# Patient Record
Sex: Female | Born: 1970 | Hispanic: Yes | Marital: Married | Smoking: Never smoker
Health system: Southern US, Community
[De-identification: ages and names within clinical notes are randomized; demographics above are authoritative.]

## PROBLEM LIST (undated history)

## (undated) DIAGNOSIS — E119 Type 2 diabetes mellitus without complications: Secondary | ICD-10-CM

## (undated) HISTORY — PX: ABDOMINAL SURGERY: SHX537

---

## 2005-08-19 ENCOUNTER — Emergency Department: Payer: Self-pay | Admitting: Emergency Medicine

## 2014-12-18 DIAGNOSIS — N611 Abscess of the breast and nipple: Secondary | ICD-10-CM | POA: Insufficient documentation

## 2015-07-07 DIAGNOSIS — N61 Mastitis without abscess: Secondary | ICD-10-CM | POA: Insufficient documentation

## 2016-05-22 ENCOUNTER — Ambulatory Visit
Admission: EM | Admit: 2016-05-22 | Discharge: 2016-05-22 | Disposition: A | Discharge status: Home / Self Care | Payer: Worker's Compensation | Attending: Emergency Medicine | Admitting: Emergency Medicine

## 2016-05-22 ENCOUNTER — Encounter: Payer: Self-pay | Admitting: Gynecology

## 2016-05-22 ENCOUNTER — Ambulatory Visit (INDEPENDENT_AMBULATORY_CARE_PROVIDER_SITE_OTHER): Payer: Worker's Compensation

## 2016-05-22 DIAGNOSIS — S93402A Sprain of unspecified ligament of left ankle, initial encounter: Secondary | ICD-10-CM

## 2016-05-22 DIAGNOSIS — T07XXXA Unspecified multiple injuries, initial encounter: Secondary | ICD-10-CM

## 2016-05-22 DIAGNOSIS — T148 Other injury of unspecified body region: Secondary | ICD-10-CM | POA: Diagnosis not present

## 2016-05-22 DIAGNOSIS — S39012A Strain of muscle, fascia and tendon of lower back, initial encounter: Secondary | ICD-10-CM

## 2016-05-22 MED ORDER — MELOXICAM 15 MG PO TABS: 15.0000 mg | ORAL_TABLET | Freq: Every day | ORAL | 0 refills | Status: DC

## 2016-05-22 NOTE — ED Provider Notes (Signed)
CSN: [id number]     Arrival date & time 05/22/16  1530 History   First MD Initiated Contact with Patient 05/22/16 1704     Chief Complaint  Patient presents with  . Work Related Injury   (Consider location/radiation/quality/duration/timing/severity/associated sxs/prior Treatment) HPI  Is a 45 year old female who is accompanied by her supervisor. Her supervisor is a Spanish-speaking individual and provides translational services during interview. 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 She states that she hurt her arm hip leg and foot as well as her low back. He has never injured her back in the past. She had no loss of consciousness but did hit her occipital area and felt dizzy but this has resolved at the present time   History reviewed. No pertinent past medical history. Past Surgical History:  Procedure Laterality Date  . ABDOMINAL SURGERY     Family History  Problem Relation Age of Onset  . Emphysema Mother    Social History  Substance Use Topics  . Smoking status: Never Smoker  . Smokeless tobacco: Never Used  . Alcohol use No   OB History    No data available     Review of Systems  Allergies  Review of patient's allergies indicates no known allergies.  Home Medications   Prior to Admission medications   Medication Sig Start Date End Date Taking? Authorizing Provider  meloxicam (MOBIC) 15 MG tablet Take 1 tablet (15 mg total) by mouth daily. 05/22/16   Lutricia Feil, PA-C   Meds Ordered and Administered this Visit  Medications - No data to display  BP 113/67 (BP Location: Right Arm)   Pulse 69   Temp 97.6 F (36.4 C) (Oral)   Resp 16   Ht 5' (1.524 m)   Wt 185 lb (83.9 kg)   LMP 05/19/2016   SpO2 99%   BMI 36.13 kg/m  No data found.   Physical Exam  Constitutional: She is oriented to person, place, and time. She appears well-developed and well-nourished. No  distress.  HENT:  Head: Normocephalic and atraumatic.  Right Ear: External ear normal.  Left Ear: External ear normal.  Mouth/Throat: Oropharynx is clear and moist.  Eyes: EOM are normal. Pupils are equal, round, and reactive to light. Right eye exhibits no discharge. Left eye exhibits no discharge.  Neck: Normal range of motion. Neck supple.  Examination cervical spine shows full range of motion discomfort at extremes of rotation. She does have some mild paracervical spinous muscle tenderness. There is no tenderness to palpation of the occipital area but no defect is noted. There is no hematoma .  Musculoskeletal: She exhibits edema and tenderness.  Examination lumbar spine shows tenderness to palpation in the lower lumbar segments L4-L5 paraspinal. He has good range of motion to forward flexion and bilateral flexion with complaints of pain with motion. Straight leg raise testing is negative and the seating positioning bilaterally 90.  Examination of the left shoulder shows no ecchymosis. She has good range of motion passively and actively. Is tenderness to palpation of the deltoid no deformity is seen there is no crepitus present.  Examination of the left elbow shows some abrasion over the proximal ulna with tenderness and ecchymosis present. Pronation and supination are full and comfortable. There is no crepitus present. He has full range of motion of the wrist.  Examination of the left ankle shows some  some swelling mostly over the anterior ankle and inferior to the lateral malleolus. Is tenderness over the anterior fibulotalar ligament. Subtalar motion is preserved.  Examination of the foot shows no ecchymosis or erythema there is no swelling present.  Neurological: She is alert and oriented to person, place, and time. She has normal reflexes. She displays normal reflexes. No cranial nerve deficit. She exhibits normal muscle tone. Coordination normal.  Skin: Skin is warm and dry. She is not  diaphoretic.  Psychiatric: She has a normal mood and affect. Her behavior is normal. Judgment and thought content normal.  Nursing note and vitals reviewed.   Urgent Care Course   Clinical Course    Procedures (including critical care time)  Labs Review Labs Reviewed - No data to display  Imaging Review Dg Elbow Complete Left  Result Date: 05/22/2016 CLINICAL DATA:  Larey SeatFell today injuring posterior left elbow with pain. Initial encounter. EXAM: LEFT ELBOW - COMPLETE 3+ VIEW COMPARISON:  None. FINDINGS: There is no evidence of fracture, dislocation, or joint effusion. There is no evidence of arthropathy or other focal bone abnormality. Soft tissues are unremarkable. IMPRESSION: Negative. Electronically Signed   By: Kennith CenterEric  Mansell M.D.   On: 05/22/2016 17:48   Dg Ankle Complete Left  Result Date: 05/22/2016 CLINICAL DATA:  Larey SeatFell today injuring left ankle.  Initial encounter. EXAM: LEFT ANKLE COMPLETE - 3+ VIEW COMPARISON:  None. FINDINGS: There is no evidence of fracture, dislocation, or joint effusion. There is no evidence of arthropathy or other focal bone abnormality. Soft tissues are unremarkable. IMPRESSION: Negative. Electronically Signed   By: Kennith CenterEric  Mansell M.D.   On: 05/22/2016 17:49     Visual Acuity Review  Right Eye Distance:   Left Eye Distance:   Bilateral Distance:    Right Eye Near:   Left Eye Near:    Bilateral Near:     Ace wrap was applied to the left ankle    MDM   1. Multiple contusions   2. Lumbar strain, initial encounter   3. Ankle sprain, left, initial encounter    New Prescriptions   MELOXICAM (MOBIC) 15 MG TABLET    Take 1 tablet (15 mg total) by mouth daily.  Plan: 1. Test/x-ray results and diagnosis reviewed with patient 2. rx as per orders; risks, benefits, potential side effects reviewed with patient 3. Recommend supportive treatment with Ice to injured areas 20 minutes out of every 2 hours as necessary for comfort. Restrictions were given to  the patient although she should be able to work under the restrictive guidelines. Restrictions are for 1 week and then she needs to [facility]. If she has any change in her sensorium or concentration or notices any neurological changes she should go immediately to the emergency room. 4. F/u prn if symptoms worsen or don't improve     Lutricia FeilWilliam P Yadhira Mckneely, PA-C 05/22/16 1823

## 2016-05-22 NOTE — Discharge Instructions (Signed)
Use ice for the bruises 20 minutes out of every 2 hours. I'll up 1 week with Hassell Halimommy Ann Moore FNP at [facility].

## 2016-05-22 NOTE — ED Triage Notes (Signed)
Per patient fell at work today and injury left side. Pt. Stated now with left lower back / arm and leg pain.

## 2016-07-30 ENCOUNTER — Emergency Department: Payer: Worker's Compensation

## 2016-07-30 ENCOUNTER — Emergency Department
Admission: EM | Admit: 2016-07-30 | Discharge: 2016-07-30 | Disposition: A | Discharge status: Home / Self Care | Payer: Worker's Compensation | Attending: Emergency Medicine | Admitting: Emergency Medicine

## 2016-07-30 DIAGNOSIS — Z79899 Other long term (current) drug therapy: Secondary | ICD-10-CM | POA: Insufficient documentation

## 2016-07-30 DIAGNOSIS — M545 Low back pain: Secondary | ICD-10-CM | POA: Insufficient documentation

## 2016-07-30 DIAGNOSIS — G8929 Other chronic pain: Secondary | ICD-10-CM

## 2016-07-30 LAB — URINALYSIS COMPLETE WITH MICROSCOPIC (ARMC ONLY)
Bacteria, UA: NONE SEEN
Bilirubin Urine: NEGATIVE
KETONES UR: NEGATIVE mg/dL
Leukocytes, UA: NEGATIVE
NITRITE: NEGATIVE
PROTEIN: 30 mg/dL — AB
Specific Gravity, Urine: 1.014 (ref 1.005–1.030)
pH: 5 (ref 5.0–8.0)

## 2016-07-30 LAB — BASIC METABOLIC PANEL
ANION GAP: 10 (ref 5–15)
BUN: 12 mg/dL (ref 6–20)
CALCIUM: 8.8 mg/dL — AB (ref 8.9–10.3)
CO2: 24 mmol/L (ref 22–32)
Chloride: 103 mmol/L (ref 101–111)
Creatinine, Ser: 0.67 mg/dL (ref 0.44–1.00)
GFR calc Af Amer: >60 mL/min (ref >60)
GFR calc non Af Amer: >60 mL/min (ref >60)
GLUCOSE: 209 mg/dL — AB (ref 65–99)
Potassium: 3.5 mmol/L (ref 3.5–5.1)
Sodium: 137 mmol/L (ref 135–145)

## 2016-07-30 LAB — CBC
HCT: 36.8 % (ref 35.0–47.0)
HEMOGLOBIN: 12.5 g/dL (ref 12.0–16.0)
MCH: 28.1 pg (ref 26.0–34.0)
MCHC: 33.9 g/dL (ref 32.0–36.0)
MCV: 82.9 fL (ref 80.0–100.0)
PLATELETS: 378 10*3/uL (ref 150–440)
RBC: 4.44 MIL/uL (ref 3.80–5.20)
RDW: 23.8 % — AB (ref 11.5–14.5)
WBC: 10.3 10*3/uL (ref 3.6–11.0)

## 2016-07-30 LAB — POCT PREGNANCY, URINE: Preg Test, Ur: NEGATIVE

## 2016-07-30 MED ORDER — NAPROXEN 500 MG PO TABS: 500.0000 mg | ORAL_TABLET | Freq: Two times a day (BID) | ORAL | 2 refills | Status: DC

## 2016-07-30 NOTE — ED Notes (Signed)
Patient transported to X-ray 

## 2016-07-30 NOTE — ED Provider Notes (Signed)
[facility] Emergency Department Provider Note   ____________________________________________    I have reviewed the triage vital signs and the nursing notes.   HISTORY  Chief Complaint Back Pain; Dizziness; and Vaginal Bleeding  Spanish interpreter used   HPI Stacy Lewis is a 45 y.o. female who presents with complaints of back pain for the last 2 months. She reports she has had back pain ever since she had a fall. She reports the pain is worse at the end of the day and is worse with bending and twisting. She denies numbness or tingling in her lower extremities. She denies weakness in her lower Eugenie FillerShirley Spears and difficulty with urination. She also reports she has been having vaginal spotting for the last 2 months as well, she is working with her PCP about this. She also reports she had dizziness yesterday but feels well today. No abdominal pain. No chest pain.   No past medical history on file.  There are no active problems to display for this patient.   Past Surgical History:  Procedure Laterality Date  . ABDOMINAL SURGERY      Prior to Admission medications   Medication Sig Start Date End Date Taking? Authorizing Provider  meloxicam (MOBIC) 15 MG tablet Take 1 tablet (15 mg total) by mouth daily. 05/22/16   Lutricia FeilWilliam P Roemer, PA-C     Allergies Patient has no known allergies.  Family History  Problem Relation Age of Onset  . Emphysema Mother     Social History Social History  Substance Use Topics  . Smoking status: Never Smoker  . Smokeless tobacco: Never Used  . Alcohol use No    Review of Systems  Constitutional: No fever/chills Eyes: No visual changes.   Cardiovascular: Denies chest pain. Respiratory: Denies shortness of breath. Gastrointestinal: No abdominal pain.   Genitourinary: Negative for dysuria. Musculoskeletal: As above  Skin: Negative for rash. Neurological: Negative for headaches or  weakness  10-point ROS otherwise negative.  ____________________________________________   PHYSICAL EXAM:  VITAL SIGNS: ED Triage Vitals  Enc Vitals Group     BP 07/30/16 1714 122/73     Pulse Rate 07/30/16 1714 82     Resp 07/30/16 1714 18     Temp 07/30/16 1714 98.3 F (36.8 C)     Temp Source 07/30/16 1714 Oral     SpO2 07/30/16 1714 100 %     Weight 07/30/16 1729 185 lb (83.9 kg)     Height 07/30/16 1729 4\' 11"  (1.499 m)     Head Circumference --      Peak Flow --      Pain Score 07/30/16 1729 8     Pain Loc --      Pain Edu? --      Excl. in [place]? --     Constitutional: Alert and oriented. No acute distress. Pleasant and interactive Eyes: Conjunctivae are normal.   Nose: No congestion/rhinnorhea. Mouth/Throat: Mucous membranes are moist.   Neck:  Painless ROM Cardiovascular: Normal rate, regular rhythm.   Good peripheral circulation. Respiratory: Normal respiratory effort.  No retractions Gastrointestinal: Soft and nontender. No distention.  No CVA tenderness. Genitourinary: deferred Musculoskeletal: Back: No vertebral tenderness to palpation, no muscle spasms felt. Normal flexion and rotation without pain. No lower extremity edema..  Lower extremities Warm and well perfused Neurologic:  Normal speech and language. No gross focal neurologic deficits are appreciated.  Skin:  Skin is warm, dry and intact. No rash noted.  ____________________________________________   LABS (all labs ordered are listed, but only abnormal results are displayed)  Labs Reviewed  BASIC METABOLIC PANEL - Abnormal; Notable for the following:       Result Value   Glucose, Bld 209 (*)    Calcium 8.8 (*)    All other components within normal limits  CBC - Abnormal; Notable for the following:    RDW 23.8 (*)    All other components within normal limits  URINALYSIS COMPLETEWITH MICROSCOPIC ([facility] ONLY) - Abnormal; Notable for the following:    Color, Urine YELLOW (*)    APPearance  HAZY (*)    Glucose, UA >500 (*)    Hgb urine dipstick 3+ (*)    Protein, ur 30 (*)    Squamous Epithelial / LPF 0-5 (*)    All other components within normal limits  POCT PREGNANCY, URINE   ____________________________________________  EKG  ED ECG REPORT I, Jene EveryKINNER, Avin Upperman, the attending physician, personally viewed and interpreted this ECG.  Date: 07/30/2016 EKG Time: 5:47 PM Rate: 81 Rhythm: normal sinus rhythm QRS Axis: normal Intervals: normal ST/T Wave abnormalities: normal Conduction Disturbances: none Narrative Interpretation: unremarkable  ____________________________________________  RADIOLOGY  X-rays normal ____________________________________________   PROCEDURES  Procedure(s) performed: No    Critical Care performed: No ____________________________________________   INITIAL IMPRESSION / ASSESSMENT AND PLAN / ED COURSE  Pertinent labs & imaging results that were available during my care of the patient were reviewed by me and considered in my medical decision making (see chart for details).  Patient presents with 2 months of back pain. She is ambulating well and is overall well-appearing. No concerning symptoms. We will check lumbar x-rays and if normal treat with NSAIDs and refer to ortho at her request  Clinical Course    ____________________________________________   FINAL CLINICAL IMPRESSION(S) / ED DIAGNOSES  Final diagnoses:  Chronic bilateral low back pain without sciatica      NEW MEDICATIONS STARTED DURING THIS VISIT:  New Prescriptions   No medications on file     Note:  This document was prepared using Dragon voice recognition software and may include unintentional dictation errors.    Jene Everyobert Iola Turri, MD 07/30/16 2102

## 2016-07-30 NOTE — ED Triage Notes (Signed)
Pt reports having a fall in 05/2016. Pt has been seen by two clinics for pain and given medicine for pain. Pt reports low back pain. Pt also reports headaches and dizziness since the fall in September. Pt reports she can not handle the back and foot pain. Pt also reports vaginal bleeding for two months.

## 2016-07-30 NOTE — ED Triage Notes (Signed)
Waiting for interpretor

## 2016-07-30 NOTE — ED Notes (Signed)
Interpretor request put in

## 2016-07-30 NOTE — ED Notes (Signed)
Interpreter request submitted to go over DC instructions with pt

## 2016-07-30 NOTE — ED Notes (Signed)
Pt states per interpreter that she has had little to no improvement in back pain since a fall in September. Pt reports the pain is in her lower back but denies pain radiating to leg and buttocks. Pt reports when she works it's difficult to bend over and turn from side to side. Pt also states she has had ongoing vaginal spotting for the last 2 months. Pt states she has her normal periods however is spotting throughout the rest of the month. Pt denies abd pain.

## 2019-03-10 ENCOUNTER — Other Ambulatory Visit: Payer: Self-pay | Admitting: Family Medicine

## 2019-03-10 DIAGNOSIS — Z20822 Contact with and (suspected) exposure to covid-19: Secondary | ICD-10-CM

## 2019-03-16 LAB — NOVEL CORONAVIRUS, NAA: SARS-CoV-2, NAA: DETECTED — AB

## 2019-06-18 ENCOUNTER — Other Ambulatory Visit: Payer: Self-pay

## 2019-06-18 DIAGNOSIS — Z20822 Contact with and (suspected) exposure to covid-19: Secondary | ICD-10-CM

## 2019-06-20 LAB — NOVEL CORONAVIRUS, NAA: SARS-CoV-2, NAA: NOT DETECTED

## 2020-06-25 ENCOUNTER — Ambulatory Visit
Admission: RE | Admit: 2020-06-25 | Discharge: 2020-06-25 | Disposition: A | Discharge status: Home / Self Care | Payer: Self-pay | Source: Ambulatory Visit | Attending: Oncology | Admitting: Oncology

## 2020-06-25 ENCOUNTER — Ambulatory Visit: Payer: Self-pay | Attending: Oncology | Admitting: *Deleted

## 2020-06-25 ENCOUNTER — Encounter: Payer: Self-pay | Admitting: *Deleted

## 2020-06-25 ENCOUNTER — Other Ambulatory Visit: Payer: Self-pay

## 2020-06-25 VITALS — BP 117/54 | HR 72 | Temp 97.1°F | Ht 59.5 in | Wt 184.9 lb

## 2020-06-25 DIAGNOSIS — Z0000 Encounter for general adult medical examination without abnormal findings: Secondary | ICD-10-CM

## 2020-06-25 NOTE — Patient Instructions (Signed)
Gave patient hand-out, Women Staying Healthy, Active and Well from [facility], with education on breast health, pap smears, heart and colon health. 

## 2020-06-25 NOTE — Progress Notes (Addendum)
  Subjective:     Patient ID: Stacy Lewis, female   DOB: 1971-08-01, 49 y.o.   MRN: [id number]  HPI   Review of Systems     Objective:   Physical Exam Chest:     Breasts:        Right: No swelling, bleeding, inverted nipple, mass, nipple discharge, skin change or tenderness.        Left: Inverted nipple present. No swelling, bleeding, mass, nipple discharge, skin change or tenderness.       Comments: Right nipple with a transverse slit Lymphadenopathy:     Upper Body:     Right upper body: No supraclavicular or axillary adenopathy.     Left upper body: No supraclavicular or axillary adenopathy.        Assessment:     49 year old Hispanic female referred to [facility] by the [facility] for clinical breast exam and mammogram.  Jacqui, the interpreter present during the interview and exam.  Clinical breast exam without dominant mass, nipple discharge or lymphadenopathy.  Left breast with inverted nipple, which is normal per patient.  Taught self breast awareness.  Last pap on 04/20/19 was negative / negative.  Patient has been screened for eligibility.  She does not have any insurance, Medicare or Medicaid.  She also meets financial eligibility.   Risk Assessment    Risk Scores      06/25/2020   Last edited by: Alta Corning, CMA   5-year risk: 0.5 %   Lifetime risk: 4.5 %            Plan:     Screening mammogram ordered.  Will follow up per [facility] protocol.

## 2020-06-30 ENCOUNTER — Inpatient Hospital Stay
Admission: RE | Admit: 2020-06-30 | Discharge: 2020-06-30 | Disposition: A | Discharge status: Home / Self Care | Payer: Self-pay | Source: Ambulatory Visit | Attending: *Deleted | Admitting: *Deleted

## 2020-06-30 ENCOUNTER — Other Ambulatory Visit: Payer: Self-pay | Admitting: *Deleted

## 2020-06-30 DIAGNOSIS — Z1231 Encounter for screening mammogram for malignant neoplasm of breast: Secondary | ICD-10-CM

## 2020-07-01 ENCOUNTER — Encounter: Payer: Self-pay | Admitting: *Deleted

## 2020-07-01 NOTE — Progress Notes (Signed)
Letter mailed from the [facility] to inform patient of her normal mammogram results.  Patient is to follow-up with annual screening in one year. 

## 2020-11-10 IMAGING — MG DIGITAL SCREENING BILAT W/ TOMO W/ CAD
8 series · 8 of 24 positions shown · non-contrast
Comparison: Previous exam(s).

CLINICAL DATA: Screening.

EXAM:
DIGITAL SCREENING BILATERAL MAMMOGRAM WITH TOMO AND CAD

[L CC synth-2D]
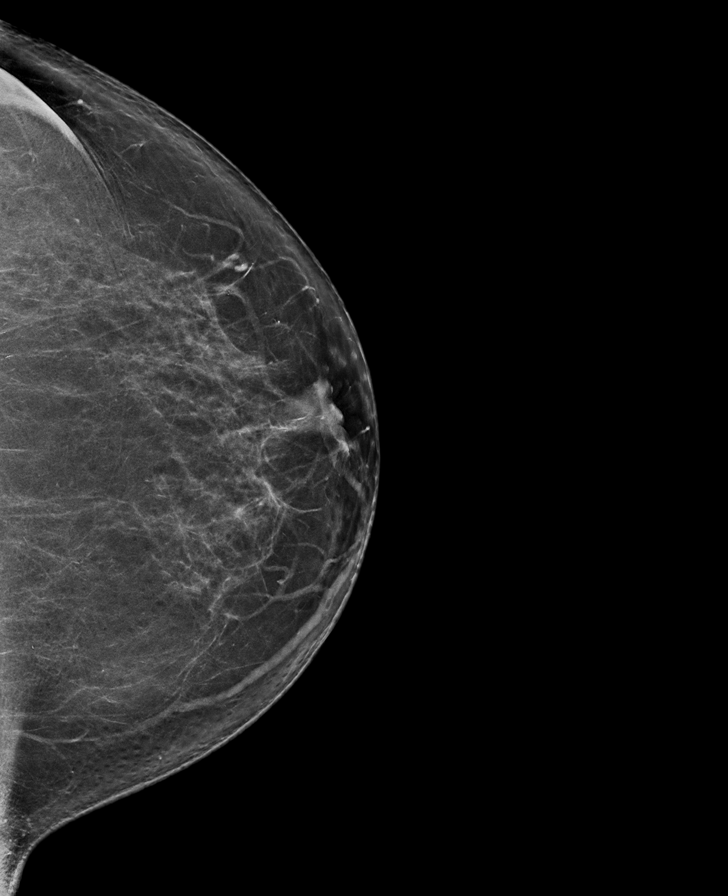

[L MLO synth-2D]
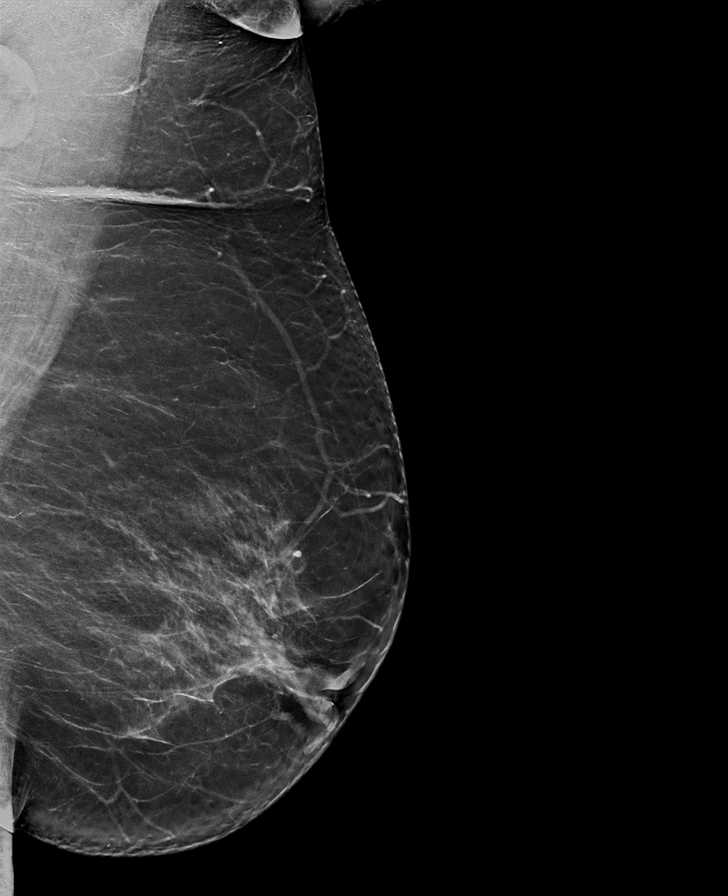

[R CC synth-2D]
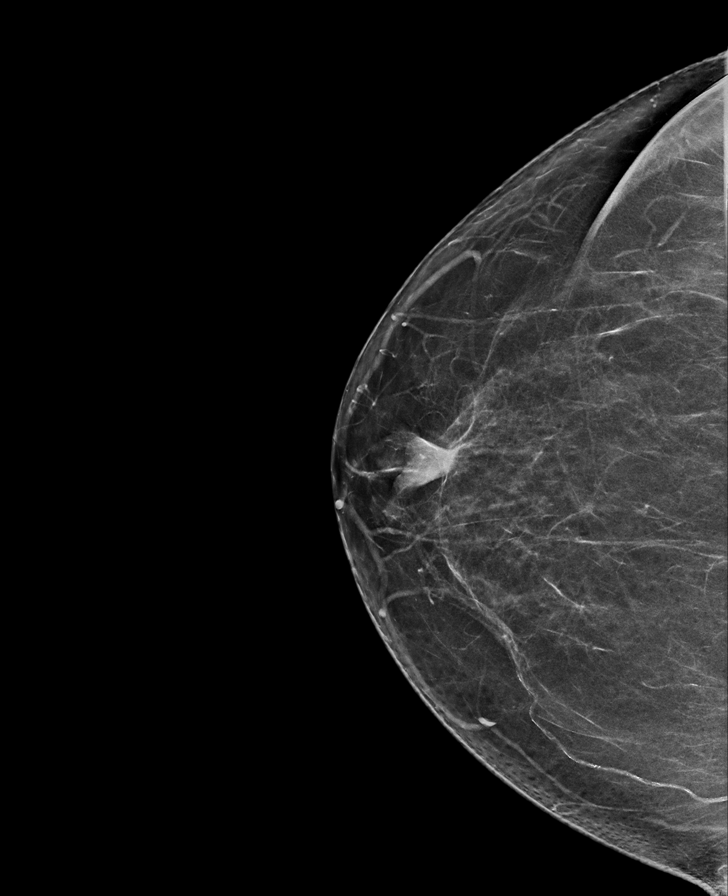

[R MLO synth-2D]
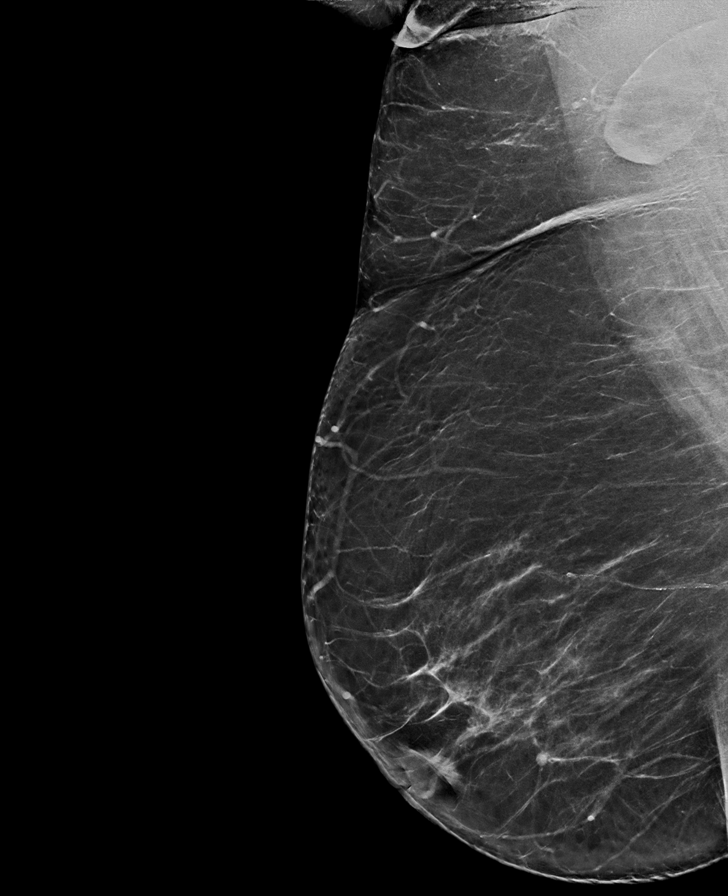

[L MLO tomo · tomo slice 39/78.0]
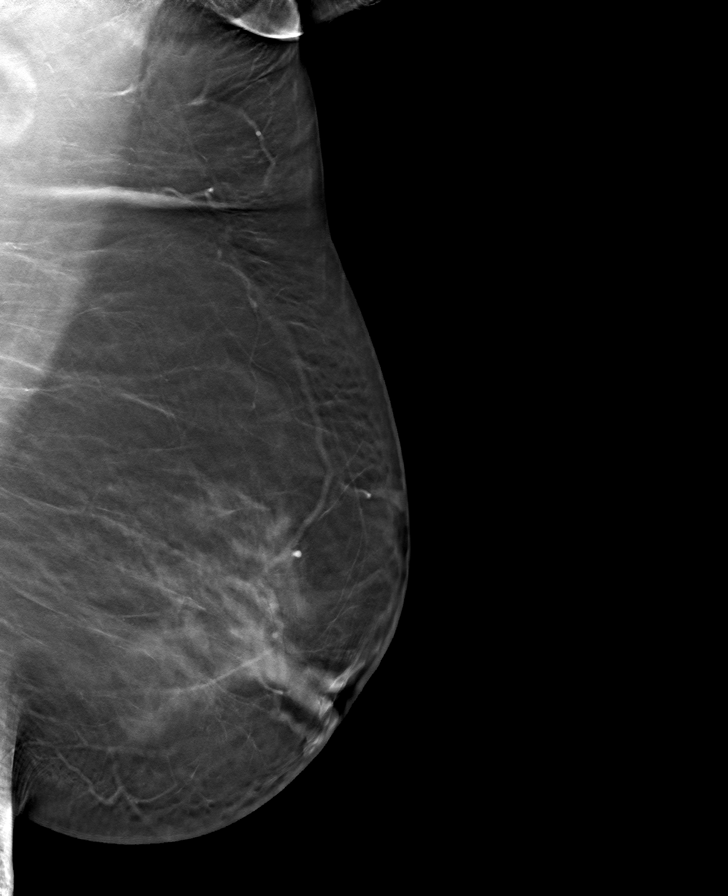

[R MLO tomo · tomo slice 41/80.0]
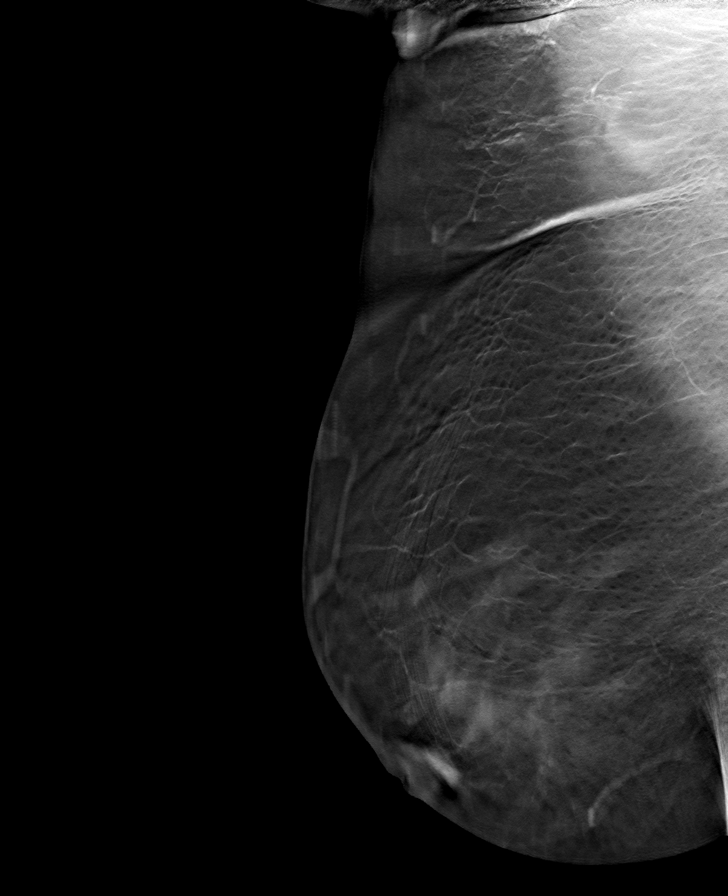

[R CC tomo · tomo slice 35/70.0]
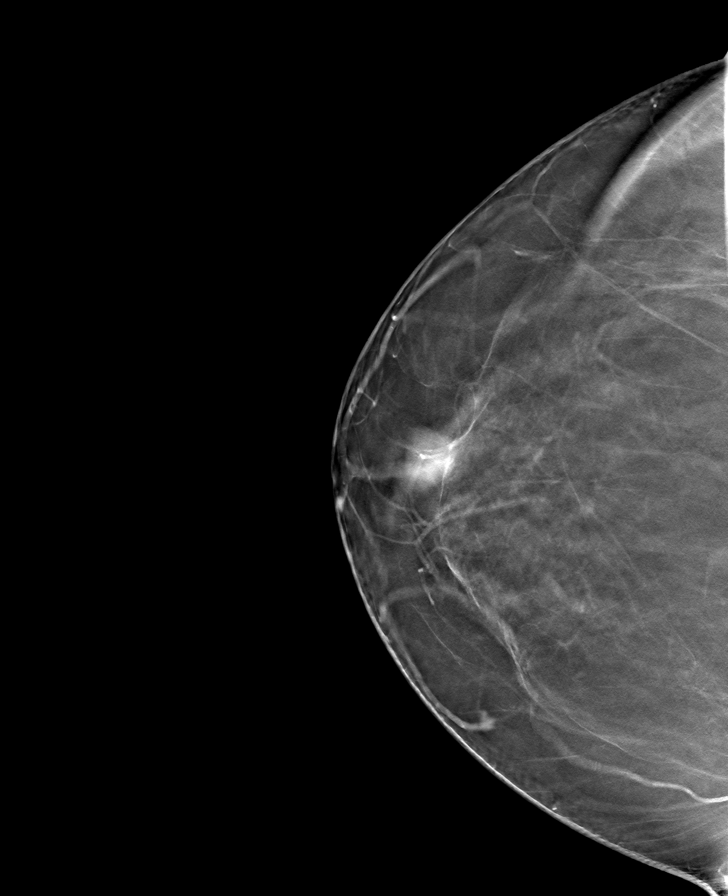

[L CC tomo · tomo slice 39/77.0]
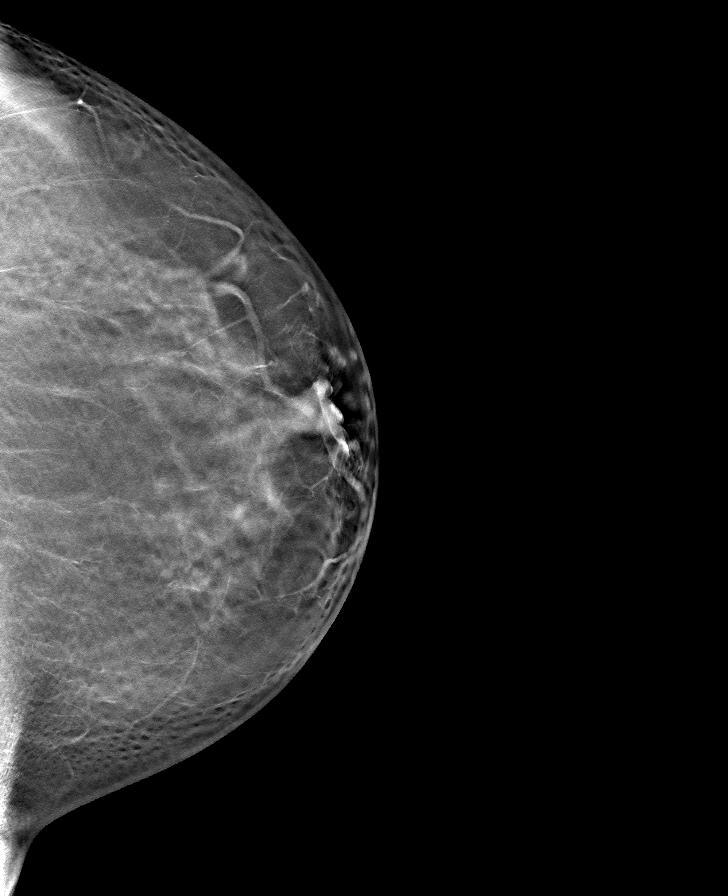

[8 of 24 positions shown; findings below may reference images not displayed]

ACR Breast Density Category b: There are scattered areas of
fibroglandular density.
FINDINGS: There are no findings suspicious for malignancy. Images were
processed with CAD.
IMPRESSION: No mammographic evidence of malignancy. A result letter of this
screening mammogram will be mailed directly to the patient.

RECOMMENDATION:
Screening mammogram in one year. (Code:[id number])

BI-RADS CATEGORY  1: Negative.

## 2021-07-01 ENCOUNTER — Ambulatory Visit: Payer: Self-pay

## 2022-08-16 ENCOUNTER — Encounter: Payer: Self-pay | Admitting: Podiatry

## 2022-08-16 ENCOUNTER — Ambulatory Visit (INDEPENDENT_AMBULATORY_CARE_PROVIDER_SITE_OTHER): Payer: Self-pay

## 2022-08-16 ENCOUNTER — Ambulatory Visit (INDEPENDENT_AMBULATORY_CARE_PROVIDER_SITE_OTHER): Payer: Self-pay | Admitting: Podiatry

## 2022-08-16 VITALS — BP 125/71 | HR 68

## 2022-08-16 DIAGNOSIS — M7752 Other enthesopathy of left foot: Secondary | ICD-10-CM

## 2022-08-16 DIAGNOSIS — S82832K Other fracture of upper and lower end of left fibula, subsequent encounter for closed fracture with nonunion: Secondary | ICD-10-CM

## 2022-08-16 DIAGNOSIS — S82302K Unspecified fracture of lower end of left tibia, subsequent encounter for closed fracture with nonunion: Secondary | ICD-10-CM

## 2022-08-16 MED ORDER — METHYLPREDNISOLONE 4 MG PO TBPK: ORAL_TABLET | ORAL | 0 refills | Status: DC

## 2022-08-16 MED ORDER — MELOXICAM 15 MG PO TABS: 15.0000 mg | ORAL_TABLET | Freq: Every day | ORAL | 3 refills | Status: DC

## 2022-08-16 NOTE — Progress Notes (Signed)
Subjective:  Patient ID: Stacy Lewis, female    DOB: 1971-05-27,  MRN: [id number] HPI Chief Complaint  Patient presents with   Ankle Pain    Lateral ankle/foot - fracture ankle May 2022, put in a cast, never really got better, stays swollen and aching, radiates dorsal and lateral foot, worse after lots of standing or walking, Ibuprofen or Tylenol   New Patient (Initial Visit)    51 y.o. female presents with the above complaint.   ROS: Denies fever chills nausea vomiting muscle aches pains calf pain back pain chest pain shortness of breath.  No past medical history on file. Past Surgical History:  Procedure Laterality Date   ABDOMINAL SURGERY      Current Outpatient Medications:    levonorgestrel (LILETTA) 20.1 MCG/DAY IUD IUD, LILETTA (52 MG) IUD, Disp: , Rfl:    meloxicam (MOBIC) 15 MG tablet, Take 1 tablet (15 mg total) by mouth daily., Disp: 30 tablet, Rfl: 3   methylPREDNISolone (MEDROL DOSEPAK) 4 MG TBPK tablet, 6 day dose pack - take as directed, Disp: 21 tablet, Rfl: 0   Cholecalciferol 1.25 MG (50000 UT) capsule, , Disp: , Rfl:    ibuprofen (ADVIL) 200 MG tablet, Take by mouth., Disp: , Rfl:    metFORMIN (GLUCOPHAGE-XR) 500 MG 24 hr tablet, , Disp: , Rfl:    pravastatin (PRAVACHOL) 80 MG tablet, , Disp: , Rfl:    traMADol (ULTRAM) 50 MG tablet, Take 1 tablet by mouth every 6 (six) hours as needed., Disp: , Rfl:   No Known Allergies Review of Systems Objective:   Vitals:   08/16/22 1609  BP: 125/71  Pulse: 68    General: Well developed, nourished, in no acute distress, alert and oriented x3   Dermatological: Skin is warm, dry and supple bilateral. Nails x 10 are well maintained; remaining integument appears unremarkable at this time. There are no open sores, no preulcerative lesions, no rash or signs of infection present.  Vascular: Dorsalis Pedis artery and Posterior Tibial artery pedal pulses are 2/4 bilateral with immedate capillary fill time. Pedal  hair growth present. No varicosities and no lower extremity edema present bilateral.   Neruologic: Grossly intact via light touch bilateral. Vibratory intact via tuning fork bilateral. Protective threshold with Semmes Wienstein monofilament intact to all pedal sites bilateral. Patellar and Achilles deep tendon reflexes 2+ bilateral. No Babinski or clonus noted bilateral.   Musculoskeletal: No gross boney pedal deformities bilateral. No pain, crepitus, or limitation noted with foot and ankle range of motion bilateral. Muscular strength 5/5 in all groups tested bilateral.  She has severe pain on palpation of the anterolateral ankle and the fibula.  She also has pain on palpation of the fifth metatarsal base left foot.  Gait: Unassisted, antalgic left   Radiographs:  Radiographs taken today ankle and foot demonstrate osseously mature individual with mild demineralization of the left foot and ankle.  Appears to be an old fracture of the left ankle which was oblique in nature does not appear to be completely healed possible nonunion is present.  Question a fracture of the medial cuneiform with no healing.  She does not demonstrate any significant acute injury to the lateral foot or ankle.  Assessment & Plan:   Assessment: Cannot rule out a nonunion of the fibula left.  Chronic pain in the left foot and ankle.  Plan: Started her on methylprednisolone to be followed by meloxicam.  She will be very careful with her sugars during this time.  We  are also requesting a CT scan of the left rear foot for evaluation and treatment.     Palak Tercero [place], [place]

## 2022-09-28 ENCOUNTER — Telehealth: Payer: Self-pay | Admitting: Podiatry

## 2022-09-28 NOTE — Telephone Encounter (Signed)
Patient called and asked could we resend the CT order to [facility] because they did not receive it. I see it in the chart.  Please advise

## 2022-09-29 NOTE — Telephone Encounter (Signed)
patient was called on 12/05 and 12/12 but no answer. Ask her to contact us at [phone] option 1 for scheduling and then option 4.

## 2022-10-12 ENCOUNTER — Emergency Department
Admission: EM | Admit: 2022-10-12 | Discharge: 2022-10-12 | Disposition: A | Discharge status: Home / Self Care | Payer: Worker's Compensation | Attending: Emergency Medicine | Admitting: Emergency Medicine

## 2022-10-12 ENCOUNTER — Other Ambulatory Visit: Payer: Self-pay

## 2022-10-12 ENCOUNTER — Emergency Department: Payer: Worker's Compensation

## 2022-10-12 ENCOUNTER — Encounter: Payer: Self-pay | Admitting: Emergency Medicine

## 2022-10-12 DIAGNOSIS — W208XXA Other cause of strike by thrown, projected or falling object, initial encounter: Secondary | ICD-10-CM | POA: Insufficient documentation

## 2022-10-12 DIAGNOSIS — T07XXXA Unspecified multiple injuries, initial encounter: Secondary | ICD-10-CM

## 2022-10-12 DIAGNOSIS — S93402A Sprain of unspecified ligament of left ankle, initial encounter: Secondary | ICD-10-CM | POA: Insufficient documentation

## 2022-10-12 DIAGNOSIS — T148XXA Other injury of unspecified body region, initial encounter: Secondary | ICD-10-CM | POA: Insufficient documentation

## 2022-10-12 DIAGNOSIS — Y990 Civilian activity done for income or pay: Secondary | ICD-10-CM | POA: Insufficient documentation

## 2022-10-12 DIAGNOSIS — S99912A Unspecified injury of left ankle, initial encounter: Secondary | ICD-10-CM | POA: Diagnosis present

## 2022-10-12 MED ORDER — MELOXICAM 15 MG PO TABS: 15.0000 mg | ORAL_TABLET | Freq: Every day | ORAL | 2 refills | Status: AC

## 2022-10-12 NOTE — Discharge Instructions (Addendum)
Patient should have light duty for 1 week.  She will not be able to stand while at work for a extended panel time.  Please give her sitting duty.  No lifting of greater than 10 pounds.  She is to see a orthopedic doctor if not improving in 1 week.

## 2022-10-12 NOTE — ED Triage Notes (Addendum)
Pt sts that she was at work and started to left hip and ankle pain after a fork lift hit her while she was at work.

## 2022-10-12 NOTE — ED Provider Notes (Signed)
[facility] Provider Note    Event Date/Time   First MD Initiated Contact with Patient 10/12/22 1647     (approximate)   History   Ankle Pain   HPI  Stacy Lewis is a 52 y.o. female presents with left ankle left hip and lower back pain.  Patient was standing behind a table at work when the forklift lifted a pallet and hit the table that fell onto her leg.  States she has swelling and pain at the ankle and left hip.  No numbness or tingling.  No other injuries reported.  Incident happened today      Physical Exam   Triage Vital Signs: ED Triage Vitals  Enc Vitals Group     BP 10/12/22 1524 135/87     Pulse Rate 10/12/22 1524 82     Resp 10/12/22 1524 19     Temp 10/12/22 1524 98 F (36.7 C)     Temp Source 10/12/22 1524 Oral     SpO2 10/12/22 1527 99 %     Weight 10/12/22 1528 185 lb (83.9 kg)     Height --      Head Circumference --      Peak Flow --      Pain Score 10/12/22 1527 8     Pain Loc --      Pain Edu? --      Excl. in [place]? --     Most recent vital signs: Vitals:   10/12/22 1524 10/12/22 1527  BP: 135/87   Pulse: 82   Resp: 19   Temp: 98 F (36.7 C)   SpO2:  99%     General: Awake, no distress.   CV:  Good peripheral perfusion. regular rate and  rhythm Resp:  Normal effort.  Abd:  No distention.   Other:  Left ankle swollen tender at the lateral malleolus, slight tenderness noted along the left hip and SI joint, patient is able to ambulate but slowly, neurovascular is intact   ED Results / Procedures / Treatments   Labs (all labs ordered are listed, but only abnormal results are displayed) Labs Reviewed - No data to display   EKG     RADIOLOGY X-ray of the left ankle and left hip    PROCEDURES:   Procedures   MEDICATIONS ORDERED IN ED: Medications - No data to display   IMPRESSION / MDM / [place] / ED COURSE  I reviewed the triage vital signs and the nursing notes.                               Differential diagnosis includes, but is not limited to, contusion, sprain, fracture  Patient's presentation is most consistent with acute complicated illness / injury requiring diagnostic workup.   X-ray of the left hip and left ankle independently reviewed and interpreted by me as being negative for any acute abnormality.  I did explain all of these findings to the patient.  We did use the video interpreter.  She was given a prescription for meloxicam.  Placed in Ace wrap.  Instructions to use ice and elevate.  Light duty for 1 week.  Separate set of instructions for her employer if needed.  She was discharged stable condition.      FINAL CLINICAL IMPRESSION(S) / ED DIAGNOSES   Final diagnoses:  Sprain of left ankle, unspecified ligament, initial encounter  Multiple contusions  Rx / DC Orders   ED Discharge Orders          Ordered    meloxicam (MOBIC) 15 MG tablet  Daily        10/12/22 1718             Note:  This document was prepared using Dragon voice recognition software and may include unintentional dictation errors.    Versie Starks, PA-C 10/12/22 1737    Blake Divine, MD 10/12/22 [phone]

## 2023-02-03 ENCOUNTER — Other Ambulatory Visit: Payer: Self-pay | Admitting: Podiatry

## 2023-02-10 ENCOUNTER — Encounter: Payer: Self-pay | Admitting: Podiatry

## 2023-02-15 ENCOUNTER — Encounter: Payer: Self-pay | Admitting: Podiatry

## 2023-02-17 ENCOUNTER — Other Ambulatory Visit: Payer: Self-pay

## 2023-02-17 ENCOUNTER — Encounter: Payer: Self-pay | Admitting: Podiatry

## 2023-02-17 ENCOUNTER — Ambulatory Visit: Payer: Worker's Compensation | Admitting: Anesthesiology

## 2023-02-17 ENCOUNTER — Ambulatory Visit
Admission: RE | Admit: 2023-02-17 | Discharge: 2023-02-17 | Disposition: A | Discharge status: Home / Self Care | Payer: Worker's Compensation | Source: Ambulatory Visit | Attending: Podiatry | Admitting: Podiatry

## 2023-02-17 ENCOUNTER — Ambulatory Visit: Payer: Self-pay

## 2023-02-17 ENCOUNTER — Encounter
Admission: RE | Disposition: A | Discharge status: Home / Self Care | Payer: Self-pay | Source: Ambulatory Visit | Attending: Podiatry

## 2023-02-17 DIAGNOSIS — X58XXXD Exposure to other specified factors, subsequent encounter: Secondary | ICD-10-CM | POA: Insufficient documentation

## 2023-02-17 DIAGNOSIS — M25372 Other instability, left ankle: Secondary | ICD-10-CM

## 2023-02-17 DIAGNOSIS — M19072 Primary osteoarthritis, left ankle and foot: Secondary | ICD-10-CM | POA: Diagnosis not present

## 2023-02-17 DIAGNOSIS — M216X2 Other acquired deformities of left foot: Secondary | ICD-10-CM | POA: Insufficient documentation

## 2023-02-17 DIAGNOSIS — S82892K Other fracture of left lower leg, subsequent encounter for closed fracture with nonunion: Secondary | ICD-10-CM | POA: Diagnosis not present

## 2023-02-17 DIAGNOSIS — M65872 Other synovitis and tenosynovitis, left ankle and foot: Secondary | ICD-10-CM | POA: Insufficient documentation

## 2023-02-17 HISTORY — PX: ORIF ANKLE FRACTURE: SHX5408

## 2023-02-17 HISTORY — PX: ANKLE ARTHROSCOPY: SHX545

## 2023-02-17 HISTORY — DX: Type 2 diabetes mellitus without complications: E11.9

## 2023-02-17 HISTORY — PX: BONE EXCISION: SHX6730

## 2023-02-17 HISTORY — PX: ANKLE FUSION: SHX881

## 2023-02-17 LAB — GLUCOSE, CAPILLARY: Glucose-Capillary: 150 mg/dL — ABNORMAL HIGH (ref 70–99)

## 2023-02-17 SURGERY — ARTHROSCOPY, ANKLE
Anesthesia: General | Site: Ankle | Laterality: Left

## 2023-02-17 MED ORDER — AMOXICILLIN-POT CLAVULANATE 875-125 MG PO TABS: 1.0000 | ORAL_TABLET | Freq: Two times a day (BID) | ORAL | 0 refills | Status: AC

## 2023-02-17 MED ORDER — 0.9 % SODIUM CHLORIDE (POUR BTL) OPTIME
TOPICAL | Status: DC | PRN
  Administered 2023-02-17: 500 mL

## 2023-02-17 MED ORDER — FENTANYL CITRATE (PF) 100 MCG/2ML IJ SOLN
INTRAMUSCULAR | Status: DC | PRN
  Administered 2023-02-17 (×2): 50 ug via INTRAVENOUS
  Administered 2023-02-17: 100 ug via INTRAVENOUS

## 2023-02-17 MED ORDER — OXYCODONE-ACETAMINOPHEN 5-325 MG PO TABS: 1.0000 | ORAL_TABLET | Freq: Four times a day (QID) | ORAL | 0 refills | Status: AC | PRN

## 2023-02-17 MED ORDER — ONDANSETRON HCL 4 MG PO TABS: 4.0000 mg | ORAL_TABLET | Freq: Three times a day (TID) | ORAL | 0 refills | Status: AC | PRN

## 2023-02-17 MED ORDER — ONDANSETRON HCL 4 MG/2ML IJ SOLN
INTRAMUSCULAR | Status: DC | PRN
  Administered 2023-02-17: 4 mg via INTRAVENOUS

## 2023-02-17 MED ORDER — LIDOCAINE HCL (CARDIAC) PF 100 MG/5ML IV SOSY
PREFILLED_SYRINGE | INTRAVENOUS | Status: DC | PRN
  Administered 2023-02-17: 100 mg via INTRATRACHEAL

## 2023-02-17 MED ORDER — DEXAMETHASONE SODIUM PHOSPHATE 4 MG/ML IJ SOLN
INTRAMUSCULAR | Status: DC | PRN
  Administered 2023-02-17: 4 mg via INTRAVENOUS

## 2023-02-17 MED ORDER — ALBUTEROL SULFATE HFA 108 (90 BASE) MCG/ACT IN AERS
INHALATION_SPRAY | RESPIRATORY_TRACT | Status: DC | PRN
  Administered 2023-02-17: 6 via RESPIRATORY_TRACT

## 2023-02-17 MED ORDER — PROPOFOL 10 MG/ML IV BOLUS
INTRAVENOUS | Status: DC | PRN
  Administered 2023-02-17: 200 mg via INTRAVENOUS

## 2023-02-17 MED ORDER — CEFAZOLIN SODIUM-DEXTROSE 2-4 GM/100ML-% IV SOLN
2.0000 g | INTRAVENOUS | Status: AC
  Administered 2023-02-17: 2 g via INTRAVENOUS

## 2023-02-17 MED ORDER — LACTATED RINGERS IV SOLN: INTRAVENOUS | Status: DC

## 2023-02-17 MED ORDER — ASPIRIN 81 MG PO TBEC: 81.0000 mg | DELAYED_RELEASE_TABLET | Freq: Two times a day (BID) | ORAL | 0 refills | Status: AC

## 2023-02-17 MED ORDER — SUCCINYLCHOLINE CHLORIDE 200 MG/10ML IV SOSY
PREFILLED_SYRINGE | INTRAVENOUS | Status: DC | PRN
  Administered 2023-02-17: 140 mg via INTRAVENOUS

## 2023-02-17 MED ORDER — MIDAZOLAM HCL 2 MG/2ML IJ SOLN
2.0000 mg | Freq: Once | INTRAMUSCULAR | Status: AC
  Administered 2023-02-17: 2 mg via INTRAVENOUS

## 2023-02-17 MED ORDER — LACTATED RINGERS IR SOLN
Status: DC | PRN
  Administered 2023-02-17: 6000 mL
  Administered 2023-02-17 (×2): 3000 mL

## 2023-02-17 SURGICAL SUPPLY — 50 items
BIT DRILL 2.5 CANN STRL (BIT) IMPLANT
BLADE SHAVER 2.9D 7 MINI (BLADE) IMPLANT
BNDG CMPR 5X4 CHSV STRCH STRL (GAUZE/BANDAGES/DRESSINGS) ×1
BNDG CMPR STD VLCR NS LF 5.8X4 (GAUZE/BANDAGES/DRESSINGS) ×2
BNDG CMPR STD VLCR NS LF 5.8X6 (GAUZE/BANDAGES/DRESSINGS) ×1
BNDG COHESIVE 4X5 TAN STRL LF (GAUZE/BANDAGES/DRESSINGS) ×1 IMPLANT
BNDG ELASTIC 4X5.8 VLCR NS LF (GAUZE/BANDAGES/DRESSINGS) ×2 IMPLANT
BNDG ELASTIC 6X5.8 VLCR NS LF (GAUZE/BANDAGES/DRESSINGS) ×1 IMPLANT
BNDG GAUZE DERMACEA FLUFF 4 (GAUZE/BANDAGES/DRESSINGS) ×1 IMPLANT
BNDG GZE DERMACEA 4 6PLY (GAUZE/BANDAGES/DRESSINGS) ×1
CANISTER SUCT 1200ML W/VALVE (MISCELLANEOUS) ×1 IMPLANT
COVER LIGHT HANDLE UNIVERSAL (MISCELLANEOUS) ×2 IMPLANT
DRAPE FLUOR MINI C-ARM 54X84 (DRAPES) IMPLANT
DRAPE STERI 35X30 U-POUCH (DRAPES) ×1 IMPLANT
DURAPREP 26ML APPLICATOR (WOUND CARE) ×1 IMPLANT
ELECT REM PT RETURN 9FT ADLT (ELECTROSURGICAL) ×1
ELECTRODE REM PT RTRN 9FT ADLT (ELECTROSURGICAL) ×1 IMPLANT
GAUZE SPONGE 4X4 12PLY STRL (GAUZE/BANDAGES/DRESSINGS) ×1 IMPLANT
GAUZE XEROFORM 1X8 LF (GAUZE/BANDAGES/DRESSINGS) ×1 IMPLANT
GLOVE BIOGEL PI IND STRL 7.5 (GLOVE) ×1 IMPLANT
GLOVE SURG SS PI 7.0 STRL IVOR (GLOVE) ×1 IMPLANT
GOWN STRL REUS W/ TWL LRG LVL3 (GOWN DISPOSABLE) ×2 IMPLANT
GOWN STRL REUS W/TWL LRG LVL3 (GOWN DISPOSABLE) ×2
IV LACTATED RINGER IRRG 3000ML (IV SOLUTION) ×4
IV LR IRRIG 3000ML ARTHROMATIC (IV SOLUTION) ×4 IMPLANT
IV NS 250ML (IV SOLUTION) ×1
IV NS 250ML BAXH (IV SOLUTION) ×1 IMPLANT
K-WIRE BB-TAK (WIRE) ×2
KIT TURNOVER KIT A (KITS) ×1 IMPLANT
KWIRE BB-TAK (WIRE) IMPLANT
MANIFOLD NEPTUNE II (INSTRUMENTS) IMPLANT
NDL SPNL 18GX3.5 QUINCKE PK (NEEDLE) ×1 IMPLANT
NEEDLE SPNL 18GX3.5 QUINCKE PK (NEEDLE) ×1 IMPLANT
NS IRRIG 500ML POUR BTL (IV SOLUTION) ×1 IMPLANT
PACK EXTREMITY ARMC (MISCELLANEOUS) ×1 IMPLANT
PADDING CAST BLEND 4X4 NS (MISCELLANEOUS) ×4 IMPLANT
PLATE LOCK THIRD TUBULAR 4H (Plate) IMPLANT
SCREW BONE KREULOCK 3.5X14 TI (Screw) IMPLANT
SPLINT CAST 1 STEP 4X30 (MISCELLANEOUS) ×1 IMPLANT
SPONGE T-LAP 18X18 ~~LOC~~+RFID (SPONGE) ×1 IMPLANT
STOCKINETTE IMPERVIOUS LG (DRAPES) ×1 IMPLANT
STOCKINETTE TUB 6IN (MISCELLANEOUS) ×1 IMPLANT
SUT ETHILON 3-0 FS-10 30 BLK (SUTURE) ×1
SUT VIC AB 3-0 SH 27 (SUTURE) ×1
SUT VIC AB 3-0 SH 27X BRD (SUTURE) IMPLANT
SUTURE EHLN 3-0 FS-10 30 BLK (SUTURE) IMPLANT
SYNDESMOSIS TIGHTROPE XP (Orthopedic Implant) IMPLANT
TUBING CONNECTING 10 (TUBING) IMPLANT
TUBING INFLOW SET DBFLO PUMP (TUBING) ×1 IMPLANT
TUBING OUTFLOW SET DBLFO PUMP (TUBING) ×1 IMPLANT

## 2023-02-17 NOTE — Op Note (Signed)
PODIATRY / FOOT AND ANKLE SURGERY OPERATIVE REPORT    SURGEON: Rosetta Posner, DPM  PRE-OPERATIVE DIAGNOSIS: All left ankle 1.  Left ankle synovitis 2.  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  Isolated syndesmotic injury with tear of anterior inferior tibiofibular ligament 5.  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  Osteochondral defect to the anterior distal lateral aspect of the talus 2.  Nonunion distal tibial fracture laterally with bony exostosis pushing into the distal talus causing osteochondral defect 3.  Syndesmotic instability  PATHOLOGY/SPECIMEN(S): None  INDICATIONS:   Stacy Lewis is a 52 y.o. female who presents with chronic left ankle pain.  Patient apparently sustained an injury at work but has had injuries previously to the left ankle.  She previously had a distal fibular fracture that was slow to heal and developed a nonunion.  She a subsequent then after that time suffered an injury to her ankle and had an MRI taken by different physician which showed a possible osteochondral defect to the anterior lateral talus, nonunion or exostosis off the distal lateral tibia from likely previous fracture or may be acute injury, tear of the anterior inferior tibiofibular ligament.  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  Discussed all treatment options with the patient both conservative and surgical attempts at correction can  potential risks and complications at this time patient is elected for surgical procedure consisting of left ankle arthroscopy with extensive debridement, osteochondral defect repair with possible microfracturing, excision of tibial exostosis, and repair of syndesmotic ligament.  No guarantees given.  Consent obtained prior to procedure.  DESCRIPTION: After obtaining full informed written consent, the patient was brought back to the operating room and placed supine upon the operating table.  The patient received IV antibiotics prior to induction.  After obtaining adequate anesthesia, the patient was prepped and draped in the standard fashion.  A popliteal nerve block was performed by anesthesia preoperatively.  An Esmarch bandage was used to exsanguinate the left lower extremity and the pneumatic thigh tourniquet was inflated.  10 cc of normal sterile saline was injected into the ankle joint medial to the tendon of the tibialis anterior at the level of the ankle joint.  Attention was then directed to the anterior ankle where an incision was made medial to the tip and then of the tibialis anterior at the level of the ankle joint.  The incision was deepened to the subcutaneous tissues utilizing sharp blunt dissection and care was taken to identify and retract all vital neurovascular structures and all venous contributories were cauterized as necessary at the area.  A hemostat was then placed into this area and blunt dissection was continued down to the ankle joint capsule which was punctured and a rush of fluid was seen from the area indicating successful entry into the ankle joint.  At this time the obturator and cannula was then placed the anterior medial portal.  The cannula was left intact and the obturator was removed and the scope was then placed into the ankle and the ankle  joint was insufflated.  The ankle was then inspected.  The medial aspect of the ankle as well as central aspect ankle appeared to be  fairly clean overall but appeared to have some mild synovitic tissue present to the area.  Laterally there appeared to be some significant scar tissue present around the distal lateral tibia with a large bone fragment present to the area which appeared to be fairly well adhered but still slightly mobile.  There appeared to also be some scar tissue impinging the anterior lateral ankle overall.  There also appeared to be a small cartilage lesion present in the area of the exostosis where it was pushing against the talus.  This lesion measured approximately 5 mm x 5 mm but overall the subchondral plate appeared to be intact.  Using transillumination the anterior lateral portal was then seen and a small incision was made over the area 15 blade, blunt dissection was continued down to the ankle joint capsule which was then punctured and a rush of fluid was seen indicating successful entry into the ankle joint.  The shaver was then placed into the ankle joint and all synovitic tissue at the anterior anterior lateral, and lateral aspect of the ankle joint was removed as much as possible.  The fibrous tissue that was present over the anterior lateral talus was removed with a grasper.  Attempts were made at removing the bone spur with the scope and with the grasper type instrumentation but the bone spur appeared to be too large to remove to the area.  The instrumentation was then switched and debridement was then performed to the anterior medial and medial aspect of the ankle joint removing some mild synovitic tissue.  The ankle joint was then exsanguinated so no fluid remained in the ankle.  Attention was then directed to the anterior lateral portal which was extended in a larger type of incision.  The incision was deepened through the subcutaneous tissues utilizing sharp and blunt dissection and care was taken to identify and retract all vital neurovascular structures and all venous contributories were cauterized as  necessary.  The superficial peroneal nerve was identified and retracted medially.  The puncture of the ankle joint capsule was then visualized and the capsular tissue was opened yet further for further inspection and visualization of the ankle joint in general.  At this time the loose body present off the distal lateral anterior tibia was seen and visualized and dissected free.  This loose piece was then resected and passed off the operative site.  Once this was resected the talus was visualized and once again the osteochondral defect was visualized at the area.  It was once again measured and measured to be 0.5 x 0.5 cm with minimal depth, subchondral bone plate intact.  Rongeur was used to smooth off the area where the exostosis was removed and the ankle was then brought through range of motion noted to have excellent motion with no crepitation.  Fluoroscopy was used to visualize resection of the bony exostosis which appeared excellent.  The ankle appeared to sit in a fairly rectus position overall.  Attention was directed to the osteochondral defect where the remaining cartilage that appeared to be damage to the area was removed with a small curette.  Once removed microfracturing was then performed with the microfracture pick to the area stimulating fibrocartilage ingrowth.  The surgical site was flushed with copious amounts normal sterile saline.  The capsular tissue at the ankle joint at the anterolateral border  was reapproximated well coapted with 3-0 Vicryl and the skin at both incision sites for the ankle scope was reapproximated well coapted with combination of 3-0 Vicryl through the subcutaneous tissue and 3-0 nylon for skin.  Attention was then directed to the lateral ankle where an incision was made over the distal fibula and lateral malleolus.  The incision was made in such a way that was visualized under fluoroscopic guidance.  The incision was deepened to the subcutaneous tissues utilizing sharp  blunt dissection care was taken to identify and retract all vital neurovascular structures all venous contributories were cauterized as necessary.  At this time a periosteal incision was made into the distal fibula and the periosteum was raised and elevated thereby exposing the distal fibula at the operative site.  At this time an Arthrex 4 hole plate was then placed over the area with the appropriate orientation.  This was done in such a way that the 2 central holes were going to be used for syndesmotic fixation and then the most outer holes were then going to be used for locking screws.  The plate was placed under fluoroscopic guidance and appeared to sit in excellent position overall.  At this time all of wires were then placed to hold the plate and temporary fixation both distally and proximally.  The 2 central holes were then left open.  Under fluoroscopic guidance utilizing standard AO principles and techniques the Arthrex tight ropes were then placed in the appropriate orientation.  The tight ropes were placed using manufactures protocol and appeared to sit in the plate excellently.  The button also appeared to sit on the medial aspect of the tibia appropriately.  Multiple views were taken in the AP, lateral, and mortise views visualizing the tight rope which appeared to sit in excellent position on both areas.  Syndesmotic instability was then tested and it was noted to be very stable at this time with no instability.  The olive wires were then removed and then to 3.5 locking screws then placed in the plate, 1 was 16 mm and the other was 14 mm.  The ankle was then stressed further and there did not appear to be any instability.  The ankle appeared to move with fluid range of motion with no crepitation.  The surgical site was flushed with copious amounts normal sterile saline.  The periosteal and capsular structures were reapproximated well coapted with 3-0 Vicryl.  The subcutaneous tissue was reapproximated  well coapted with 3-0 Vicryl.  Skin was then reapproximated well coapted with 3-0 nylon in horizontal mattress type stitching.  The pneumatic thigh tourniquet was deflated and a prompt hyperemic response was noted all digits left foot.  Postoperative dressing was then applied consisting of Xeroform to the incision areas followed by 4 x 4 gauze, gauze roll, Webril, posterior splint, Ace wrap.  The patient tolerated the procedure and anesthesia well was transferred to recovery room + stable vascular status intact all toes left foot.  Following.  Postoperative monitoring the patient be discharged home with the appropriate orders, instructions, and medications.  Patient is to remain nonweightbearing at all times left lower extremity for the next 2 weeks at least.  Patient to follow-up in 1 week for further evaluation in outpatient clinic.  COMPLICATIONS: None  CONDITION: Good, stable  Rosetta Posner, DPM

## 2023-02-17 NOTE — Discharge Instructions (Signed)
[facility]  POST OPERATIVE INSTRUCTIONS FOR DR. Ether Griffins AND DR. Annye Forrey [facility] PODIATRY DEPARTMENT   Take your medication as prescribed.  Pain medication should be taken only as needed.  You may take Tylenol or ibuprofen between pain medication doses if needed.  Maximum dose of Tylenol or acetaminophen per day is 4000 mg, maximum dose of ibuprofen is 3200 mg.  If pain is still severe try taking 1 pain tablet every 4 hours.  If still severe try taking 2 pain tablets every 6 hours.  If still severe try taking 2 pain tablets every 4 hours.  Try to take the pain medication now as prescribed.  Keep the dressing clean, dry and intact.  Remain nonweightbearing at all times left lower extremity for the next 2 weeks using knee scooter, crutches, or wheelchair.  Keep your foot elevated above the heart level for the first 48 hours.  Continue elevation thereafter to improve swelling.  May also place ice to the front of the left ankle for maximum 10 minutes out of every 1 hour as needed  Walking to the bathroom and brief periods of walking are acceptable, unless we have instructed you to be non-weight bearing.  Always wear your post-op shoe when walking.  Always use your crutches if you are to be non-weight bearing.  Do not take a shower. Baths are permissible as long as the foot is kept out of the water.   Every hour you are awake:  Bend your knee 15 times. Massage calf 15 times  Call [facility] [phone]) if any of the following problems occur: You develop a temperature or fever. The bandage becomes saturated with blood. Medication does not stop your pain. Injury of the foot occurs. Any symptoms of infection including redness, odor, or red streaks running from wound.

## 2023-02-17 NOTE — H&P (Signed)
HISTORY AND PHYSICAL INTERVAL NOTE:  02/17/2023  7:07 AM  Stacy Lewis  has presented today for surgery, with the diagnosis of S93.432A - Ankle syndesmosis disruption, left S82.892K - Closed left ankle fracture, with nonunion M95.8 - Osteochondral defect of ankle M89.8X7 - Exostosis of bone of ankle.  The various methods of treatment have been discussed with the patient.  No guarantees were given.  After consideration of risks, benefits and other options for treatment, the patient has consented to surgery.  I have reviewed the patients' chart and labs.    PROCEDURE: ALL LEFT ANKLE ANKLE ARTHROSCOPY WITH EXTENSIVE DEBRIDEMENT POSSIBLE ANKLE OSTEOCHONDRAL DEFECT REPAIR  REMOVAL OF BONY EXOSTOSIS ANKLE  A history and physical examination was performed in my office.  The patient was reexamined.  There have been no changes to this history and physical examination.  Rosetta Posner, DPM

## 2023-02-17 NOTE — Anesthesia Postprocedure Evaluation (Signed)
Anesthesia Post Note  Patient: Stacy Lewis  Procedure(s) Performed: ANKLE ARTHROSCOPY (Left: Ankle) ARTHRODESIS ANKLE (Left: Ankle) 09811 - TIBIA SPUR REMOVAL (Left: Ankle) [id number] - SYNDESMOTIC REPAIR (Left: Ankle)  Patient location during evaluation: PACU Anesthesia Type: General Level of consciousness: awake and alert Pain management: pain level controlled Vital Signs Assessment: post-procedure vital signs reviewed and stable Respiratory status: spontaneous breathing, nonlabored ventilation, respiratory function stable and patient connected to nasal cannula oxygen Cardiovascular status: blood pressure returned to baseline and stable Postop Assessment: no apparent nausea or vomiting Anesthetic complications: no   No notable events documented.   Last Vitals:  Vitals:   02/17/23 1145 02/17/23 1200  BP: (!) 155/89 (!) 154/84  Pulse: 90 84  Resp: (!) 25 20  Temp:    SpO2: 91% 95%    Last Pain:  Vitals:   02/17/23 0820  TempSrc: Temporal  PainSc:                  [name]

## 2023-02-17 NOTE — Anesthesia Preprocedure Evaluation (Addendum)
Anesthesia Evaluation  Patient identified by MRN, date of birth, ID band Patient awake    Reviewed: Allergy & Precautions, H&P , NPO status , Patient's Chart, lab work & pertinent test results  Airway Mallampati: IV  TM Distance: >3 FB Neck ROM: Full  Mouth opening: Limited Mouth Opening  Dental no notable dental hx.    Pulmonary neg pulmonary ROS   Pulmonary exam normal breath sounds clear to auscultation       Cardiovascular negative cardio ROS Normal cardiovascular exam Rhythm:Regular Rate:Normal     Neuro/Psych negative neurological ROS  negative psych ROS   GI/Hepatic Neg liver ROS,GERD  ,,Severe GERD, awakens her at night   Endo/Other  negative endocrine ROSdiabetes, Type 2, Oral Hypoglycemic Agents  Obesity BMI 38.78  Renal/GU negative Renal ROS  negative genitourinary   Musculoskeletal negative musculoskeletal ROS (+)    Abdominal   Peds negative pediatric ROS (+)  Hematology negative hematology ROS (+)   Anesthesia Other Findings Translator used   Reproductive/Obstetrics negative OB ROS                             Anesthesia Physical Anesthesia Plan  ASA: 2  Anesthesia Plan: General ETT   Post-op Pain Management: Regional block   Induction: Intravenous  PONV Risk Score and Plan:   Airway Management Planned: LMA  Additional Equipment:   Intra-op Plan:   Post-operative Plan: Extubation in OR  Informed Consent: I have reviewed the patients History and Physical, chart, labs and discussed the procedure including the risks, benefits and alternatives for the proposed anesthesia with the patient or authorized representative who has indicated his/her understanding and acceptance.     Dental Advisory Given  Plan Discussed with: Anesthesiologist, CRNA and Surgeon  Anesthesia Plan Comments: (Patient consented for risks of anesthesia including but not limited to:  -  adverse reactions to medications - damage to eyes, teeth, lips or other oral mucosa - nerve damage due to positioning  - sore throat or hoarseness - Damage to heart, brain, nerves, lungs, other parts of body or loss of life  Patient voiced understanding.)        Anesthesia Quick Evaluation

## 2023-02-17 NOTE — Transfer of Care (Signed)
Immediate Anesthesia Transfer of Care Note  Patient: Stacy Lewis  Procedure(s) Performed: ANKLE ARTHROSCOPY (Left: Ankle) ARTHRODESIS ANKLE (Left: Ankle) [id number] - TIBIA SPUR REMOVAL (Left) [id number] - SYNDESMOTIC REPAIR (Left: Ankle)  Patient Location: PACU  Anesthesia Type: General ETT  Level of Consciousness: awake, alert  and patient cooperative  Airway and Oxygen Therapy: Patient Spontanous Breathing and Patient connected to supplemental oxygen  Post-op Assessment: Post-op Vital signs reviewed, Patient's Cardiovascular Status Stable, Respiratory Function Stable, Patent Airway and No signs of Nausea or vomiting  Post-op Vital Signs: Reviewed and stable  Complications: No notable events documented.

## 2023-02-18 ENCOUNTER — Encounter: Payer: Self-pay | Admitting: Podiatry

## 2024-03-15 LAB — GLUCOSE, POCT (MANUAL RESULT ENTRY): POC Glucose: 209 mg/dL — AB (ref 70–99)

## 2024-03-15 NOTE — Congregational Nurse Program (Signed)
  Dept: [phone]   Congregational Nurse Program Note  Date of Encounter: 03/15/2024  Past Medical History: Past Medical History:  Diagnosis Date   Diabetes mellitus without complication (HCC)    No longer on meds    Encounter Details:  Community Questionnaire - 03/15/24 1758       Questionnaire   Ask client: Do you give verbal consent for me to treat you today? Yes    Student Assistance N/A    Location Patient Served  [place]    Encounter Setting CN site    Population Status Unknown    Engineer, building services or [place] Insurance    Insurance/Financial Assistance Referral N/A    Medication N/A    Medical Provider Yes    Screening Referrals Made N/A    Medical Referrals Made N/A    Medical Appointment Completed N/A    CNP Interventions Advocate/Support    Screenings CN Performed Blood Pressure;Blood Glucose    ED Visit Averted N/A    Life-Saving Intervention Made N/A         Encouraged patient to see PCP regarding high blood sugar and high blood pressure.  Provided patient with handout in Spanish on preventing type 2 diabetes and hypertension.
